# Patient Record
Sex: Female | Born: 1970 | Race: White | Hispanic: No | Marital: Married | Smoking: Never smoker
Health system: Southern US, Community
[De-identification: ages and names within clinical notes are randomized; demographics above are authoritative.]

## PROBLEM LIST (undated history)

## (undated) DIAGNOSIS — F32A Depression, unspecified: Secondary | ICD-10-CM

## (undated) DIAGNOSIS — R109 Unspecified abdominal pain: Secondary | ICD-10-CM

## (undated) DIAGNOSIS — Z87442 Personal history of urinary calculi: Secondary | ICD-10-CM

## (undated) DIAGNOSIS — F988 Other specified behavioral and emotional disorders with onset usually occurring in childhood and adolescence: Secondary | ICD-10-CM

## (undated) DIAGNOSIS — Z87440 Personal history of urinary (tract) infections: Secondary | ICD-10-CM

## (undated) DIAGNOSIS — F329 Major depressive disorder, single episode, unspecified: Secondary | ICD-10-CM

## (undated) HISTORY — DX: Unspecified abdominal pain: R10.9

## (undated) HISTORY — DX: Personal history of urinary (tract) infections: Z87.440

## (undated) HISTORY — DX: Depression, unspecified: F32.A

## (undated) HISTORY — PX: OTHER SURGICAL HISTORY: SHX169

## (undated) HISTORY — DX: Other specified behavioral and emotional disorders with onset usually occurring in childhood and adolescence: F98.8

## (undated) HISTORY — DX: Major depressive disorder, single episode, unspecified: F32.9

## (undated) HISTORY — DX: Personal history of urinary calculi: Z87.442

---

## 2007-09-02 ENCOUNTER — Ambulatory Visit: Payer: Self-pay | Admitting: Internal Medicine

## 2010-10-22 ENCOUNTER — Emergency Department: Payer: Self-pay | Admitting: Emergency Medicine

## 2010-12-09 ENCOUNTER — Ambulatory Visit: Payer: Self-pay | Admitting: Family Medicine

## 2011-10-04 ENCOUNTER — Emergency Department: Payer: Self-pay | Admitting: Emergency Medicine

## 2011-10-04 LAB — URINALYSIS, COMPLETE
Bacteria: NONE SEEN
Bilirubin,UR: NEGATIVE
Glucose,UR: NEGATIVE mg/dL (ref 0–75)
Ketone: NEGATIVE
Ph: 6 (ref 4.5–8.0)
Protein: NEGATIVE
RBC,UR: 1 /HPF (ref 0–5)
Specific Gravity: 1.011 (ref 1.003–1.030)
Squamous Epithelial: NONE SEEN
WBC UR: 5 /HPF (ref 0–5)

## 2011-10-04 LAB — CBC
HCT: 39.4 % (ref 35.0–47.0)
HGB: 13.4 g/dL (ref 12.0–16.0)
MCH: 31.8 pg (ref 26.0–34.0)
MCHC: 33.9 g/dL (ref 32.0–36.0)

## 2011-10-04 LAB — BASIC METABOLIC PANEL
Calcium, Total: 8.7 mg/dL (ref 8.5–10.1)
Co2: 27 mmol/L (ref 21–32)
Creatinine: 0.67 mg/dL (ref 0.60–1.30)
EGFR (African American): >60
EGFR (Non-African Amer.): >60
Potassium: 3.4 mmol/L — ABNORMAL LOW (ref 3.5–5.1)

## 2014-08-08 ENCOUNTER — Ambulatory Visit: Payer: Self-pay

## 2015-09-16 ENCOUNTER — Encounter: Payer: Self-pay | Admitting: *Deleted

## 2015-09-16 ENCOUNTER — Ambulatory Visit (INDEPENDENT_AMBULATORY_CARE_PROVIDER_SITE_OTHER): Admitting: Obstetrics and Gynecology

## 2015-09-16 VITALS — BP 124/85 | HR 80 | Resp 16 | Ht 64.0 in | Wt 149.0 lb

## 2015-09-16 DIAGNOSIS — F32A Depression, unspecified: Secondary | ICD-10-CM | POA: Insufficient documentation

## 2015-09-16 DIAGNOSIS — N200 Calculus of kidney: Secondary | ICD-10-CM | POA: Diagnosis not present

## 2015-09-16 DIAGNOSIS — F329 Major depressive disorder, single episode, unspecified: Secondary | ICD-10-CM | POA: Insufficient documentation

## 2015-09-16 DIAGNOSIS — F988 Other specified behavioral and emotional disorders with onset usually occurring in childhood and adolescence: Secondary | ICD-10-CM | POA: Insufficient documentation

## 2015-09-16 DIAGNOSIS — R109 Unspecified abdominal pain: Secondary | ICD-10-CM | POA: Diagnosis not present

## 2015-09-16 LAB — URINALYSIS, COMPLETE
Bilirubin, UA: NEGATIVE
Glucose, UA: NEGATIVE
LEUKOCYTES UA: NEGATIVE
Nitrite, UA: NEGATIVE
Specific Gravity, UA: 1.02 (ref 1.005–1.030)
Urobilinogen, Ur: 0.2 mg/dL (ref 0.2–1.0)
pH, UA: 6 (ref 5.0–7.5)

## 2015-09-16 LAB — MICROSCOPIC EXAMINATION

## 2015-09-16 NOTE — Progress Notes (Signed)
09/16/2015 12:46 PM   Vickie Raymond 05/20/1971 [id number]  Referring provider: No referring provider defined for this encounter.  Chief Complaint  Patient presents with  . Nephrolithiasis  . Establish Care    HPI: Patient is a 44yo female with a history of renal stones presenting today for follow up after being seen at an ED in [place] on 09/11/15.  CT w/o contrast showing no renal or ureteral stones (only report available). She was diagnosed with pylonephritis and treated with Keflex.  Urine dip was nitrite positive. No culture available.   She reports she was first diagnosed with renal stones in 1998 while she was pregnant. She was later seen in the ED in 2000. A ureteral stent was placed at that time and stone was able to pass. She was then seen by Urology with plans for remaining renal stone removal but was deployed prior to surgery and did not follow up when she returned.  Patient's last visit with [place] was approximately 1 year ago. A CT renal stone study was ordered at that time noting  Bilateral nonobstructing renal stones.  7 x 5 mm left lower pole renal stone as well as a 1 millimeter right upper pole renal stone. Patient was to be scheduled for a URS for left renal stone removal but did not follow up for surgery to be scheduled.   Currently she denies pain, dysuria, frequency, urgency or fevers. No gross hematuria.    PMH: Past Medical History  Diagnosis Date  . ADD (attention deficit disorder)   . Depression   . Pain, flank, bilateral   . History of UTI   . History of kidney stones     Surgical History: Past Surgical History  Procedure Laterality Date  . Kidney stone removal      Home Medications:    Medication List       This list is accurate as of: 09/16/15 12:46 PM.  Always use your most recent med list.               cephALEXin 500 MG capsule  Commonly known as:  KEFLEX  Take 500 mg by mouth 3 (three) times daily.        Allergies:   Allergies  Allergen Reactions  . Penicillin G     Family History: Family History  Problem Relation Age of Onset  . Kidney disease Neg Hx   . Cervical cancer Mother   . Bladder Cancer Paternal Uncle   . Colon cancer Paternal Grandmother     Social History:  reports that she has never smoked. She does not have any smokeless tobacco history on file. She reports that she drinks alcohol. Her drug history is not on file.  ROS: UROLOGY Frequent Urination?: No Hard to postpone urination?: No Burning/pain with urination?: No Get up at night to urinate?: No Leakage of urine?: No Urine stream starts and stops?: No Trouble starting stream?: No Do you have to strain to urinate?: No Blood in urine?: No Urinary tract infection?: Yes Sexually transmitted disease?: No Injury to kidneys or bladder?: No Painful intercourse?: No Weak stream?: No Currently pregnant?: No Vaginal bleeding?: No Last menstrual period?: 09/15/2015  Gastrointestinal Nausea?: Yes Vomiting?: Yes Indigestion/heartburn?: No Diarrhea?: No Constipation?: No  Constitutional Fever: No Night sweats?: No Weight loss?: No Fatigue?: No  Skin Skin rash/lesions?: No Itching?: No  Eyes Blurred vision?: No Double vision?: No  Ears/Nose/Throat Sore throat?: No Sinus problems?: No  Hematologic/Lymphatic Swollen glands?: No Easy bruising?: No  Cardiovascular Leg swelling?: No Chest pain?: No  Respiratory Cough?: No Shortness of breath?: No  Endocrine Excessive thirst?: No  Musculoskeletal Back pain?: Yes Joint pain?: No  Neurological Headaches?: No Dizziness?: No  Psychologic Depression?: No Anxiety?: No  Physical Exam: BP 124/85 mmHg  Pulse 80  Resp 16  Ht 5\' 4"  (1.626 m)  Wt 149 lb (67.586 kg)  BMI 25.56 kg/m2  Constitutional:  Alert and oriented, No acute distress. HEENT: [place] AT, moist mucus membranes.  Trachea midline, no masses. Cardiovascular: No clubbing, cyanosis, or  edema. Respiratory: Normal respiratory effort, no increased work of breathing. GI: Abdomen is soft, nontender, nondistended, no abdominal masses GU: No CVA tenderness.  Skin: No rashes, bruises or suspicious lesions. Lymph: No cervical or inguinal adenopathy. Neurologic: Grossly intact, no focal deficits, moving all 4 extremities. Psychiatric: Normal mood and affect.  Laboratory Data:   Urinalysis    Component Value Date/Time   COLORURINE Yellow 10/04/2011 2110   APPEARANCEUR Hazy 10/04/2011 2110   LABSPEC 1.011 10/04/2011 2110   PHURINE 6.0 10/04/2011 2110   GLUCOSEU Negative 10/04/2011 2110   HGBUR Negative 10/04/2011 2110   BILIRUBINUR Negative 10/04/2011 2110   KETONESUR Negative 10/04/2011 2110   PROTEINUR Negative 10/04/2011 2110   NITRITE Negative 10/04/2011 2110   LEUKOCYTESUR 1+ 10/04/2011 2110    Pertinent Imaging:   Assessment & Plan:    1. Nephrolithiasis-  Most recent CT scan without contrast showing no renal or ureteral stones. Patient reports improvement in her urinary symptoms. She does not recall passing her previously identified left renal stone.   She would like to be sure that the stone no longer resides in her kidney. We will order a renal ultrasound and a KUB for further confirmation though I explained to her that a CT scan is more specific than these 2 studies.   2. Left Flank Pain- Patient reports mild residual left flank soreness but states that pain has significantly improved since she has been taking Keflex.  3.  Acute Cystitis-  We will recheck patient's urine at her follow-up visit after completion of antibiotics.  There are no diagnoses linked to this encounter.  Return for RUS and KUB results..  These notes generated with voice recognition software. I apologize for typographical errors.  Earlie LouLindsay Davetta Olliff, FNP  [facility] [address], [address] [place], [place] [address] [phone]

## 2015-09-17 ENCOUNTER — Encounter: Payer: Self-pay | Admitting: *Deleted

## 2015-09-18 ENCOUNTER — Ambulatory Visit (INDEPENDENT_AMBULATORY_CARE_PROVIDER_SITE_OTHER): Admitting: Obstetrics and Gynecology

## 2015-09-18 ENCOUNTER — Ambulatory Visit
Admission: RE | Admit: 2015-09-18 | Discharge: 2015-09-18 | Disposition: A | Discharge status: Home / Self Care | Source: Ambulatory Visit | Attending: Obstetrics and Gynecology | Admitting: Obstetrics and Gynecology

## 2015-09-18 ENCOUNTER — Encounter: Payer: Self-pay | Admitting: Obstetrics and Gynecology

## 2015-09-18 VITALS — BP 120/81 | HR 64 | Resp 16 | Ht 64.0 in | Wt 149.8 lb

## 2015-09-18 DIAGNOSIS — Z87442 Personal history of urinary calculi: Secondary | ICD-10-CM | POA: Insufficient documentation

## 2015-09-18 DIAGNOSIS — R109 Unspecified abdominal pain: Secondary | ICD-10-CM | POA: Insufficient documentation

## 2015-09-18 DIAGNOSIS — N200 Calculus of kidney: Secondary | ICD-10-CM

## 2015-09-18 DIAGNOSIS — N281 Cyst of kidney, acquired: Secondary | ICD-10-CM | POA: Insufficient documentation

## 2015-09-18 LAB — MICROSCOPIC EXAMINATION
RBC, UA: NONE SEEN /hpf (ref 0–?)
WBC UA: NONE SEEN /HPF (ref 0–?)

## 2015-09-18 LAB — URINALYSIS, COMPLETE
BILIRUBIN UA: NEGATIVE
GLUCOSE, UA: NEGATIVE
Ketones, UA: NEGATIVE
LEUKOCYTES UA: NEGATIVE
Nitrite, UA: NEGATIVE
PROTEIN UA: NEGATIVE
Specific Gravity, UA: 1.02 (ref 1.005–1.030)
UUROB: 0.2 mg/dL (ref 0.2–1.0)
pH, UA: 5.5 (ref 5.0–7.5)

## 2015-09-18 NOTE — Patient Instructions (Signed)
Dietary Guidelines to Help Prevent Kidney Stones °Your risk of kidney stones can be decreased by adjusting the foods you eat. The most important thing you can do is drink enough fluid. You should drink enough fluid to keep your urine clear or pale yellow. The following guidelines provide specific information for the type of kidney stone you have had. °GUIDELINES ACCORDING TO TYPE OF KIDNEY STONE °Calcium Oxalate Kidney Stones °· Reduce the amount of salt you eat. Foods that have a lot of salt cause your body to release excess calcium into your urine. The excess calcium can combine with a substance called oxalate to form kidney stones. °· Reduce the amount of animal protein you eat if the amount you eat is excessive. Animal protein causes your body to release excess calcium into your urine. Ask your dietitian how much protein from animal sources you should be eating. °· Avoid foods that are high in oxalates. If you take vitamins, they should have less than 500 mg of vitamin C. Your body turns vitamin C into oxalates. You do not need to avoid fruits and vegetables high in vitamin C. °Calcium Phosphate Kidney Stones °· Reduce the amount of salt you eat to help prevent the release of excess calcium into your urine. °· Reduce the amount of animal protein you eat if the amount you eat is excessive. Animal protein causes your body to release excess calcium into your urine. Ask your dietitian how much protein from animal sources you should be eating. °· Get enough calcium from food or take a calcium supplement (ask your dietitian for recommendations). Food sources of calcium that do not increase your risk of kidney stones include: °¨ Broccoli. °¨ Dairy products, such as cheese and yogurt. °¨ Pudding. °Uric Acid Kidney Stones °· Do not have more than 6 oz of animal protein per day. °FOOD SOURCES °Animal Protein Sources °· Meat (all types). °· Poultry. °· Eggs. °· Fish, seafood. °Foods High in Salt °· Salt seasonings. °· Soy  sauce. °· Teriyaki sauce. °· Cured and processed meats. °· Salted crackers and snack foods. °· Fast food. °· Canned soups and most canned foods. °Foods High in Oxalates °· Grains: °¨ Amaranth. °¨ Barley. °¨ Grits. °¨ Wheat germ. °¨ Bran. °¨ Buckwheat flour. °¨ All bran cereals. °¨ Pretzels. °¨ Whole wheat bread. °· Vegetables: °¨ Beans (wax). °¨ Beets and beet greens. °¨ Collard greens. °¨ Eggplant. °¨ Escarole. °¨ Leeks. °¨ Okra. °¨ Parsley. °¨ Rutabagas. °¨ Spinach. °¨ Swiss chard. °¨ Tomato paste. °¨ Fried potatoes. °¨ Sweet potatoes. °· Fruits: °¨ Red currants. °¨ Figs. °¨ Kiwi. °¨ Rhubarb. °· Meat and Other Protein Sources: °¨ Beans (dried). °¨ Soy burgers and other soybean products. °¨ Miso. °¨ Nuts (peanuts, almonds, pecans, cashews, hazelnuts). °¨ Nut butters. °¨ Sesame seeds and tahini (paste made of sesame seeds). °¨ Poppy seeds. °· Beverages: °¨ Chocolate drink mixes. °¨ Soy milk. °¨ Instant iced tea. °¨ Juices made from high-oxalate fruits or vegetables. °· Other: °¨ Carob. °¨ Chocolate. °¨ Fruitcake. °¨ Marmalades. °  °This information is not intended to replace advice given to you by your health care provider. Make sure you discuss any questions you have with your health care provider. °  °Document Released: 01/09/2011 Document Revised: 09/19/2013 Document Reviewed: 08/11/2013 °Elsevier Interactive Patient Education ©2016 Else[employer] Inc[employer] ° ° °Kidney Stones °Kidney stones (urolithiasis) are deposits that form inside your kidneys. The intense pain is caused by the stone moving through the urinary tract. When the stone moves, the   ureter goes into spasm around the stone. The stone is usually passed in the urine.  °CAUSES  °· A disorder that makes certain neck glands produce too much parathyroid hormone (primary hyperparathyroidism). °· A buildup of uric acid crystals, similar to gout in your joints. °· Narrowing (stricture) of the ureter. °· A kidney obstruction present at birth (congenital  obstruction). °· Previous surgery on the kidney or ureters. °· Numerous kidney infections. °SYMPTOMS  °· Feeling sick to your stomach (nauseous). °· Throwing up (vomiting). °· Blood in the urine (hematuria). °· Pain that usually spreads (radiates) to the groin. °· Frequency or urgency of urination. °DIAGNOSIS  °· Taking a history and physical exam. °· Blood or urine tests. °· CT scan. °· Occasionally, an examination of the inside of the urinary bladder (cystoscopy) is performed. °TREATMENT  °· Observation. °· Increasing your fluid intake. °· Extracorporeal shock wave lithotripsy--This is a noninvasive procedure that uses shock waves to break up kidney stones. °· Surgery may be needed if you have severe pain or persistent obstruction. There are various surgical procedures. Most of the procedures are performed with the use of small instruments. Only small incisions are needed to accommodate these instruments, so recovery time is minimized. °The size, location, and chemical composition are all important variables that will determine the proper choice of action for you. Talk to your health care provider to better understand your situation so that you will minimize the risk of injury to yourself and your kidney.  °HOME CARE INSTRUCTIONS  °· Drink enough water and fluids to keep your urine clear or pale yellow. This will help you to pass the stone or stone fragments. °· Strain all urine through the provided strainer. Keep all particulate matter and stones for your health care provider to see. The stone causing the pain may be as small as a grain of salt. It is very important to use the strainer each and every time you pass your urine. The collection of your stone will allow your health care provider to analyze it and verify that a stone has actually passed. The stone analysis will often identify what you can do to reduce the incidence of recurrences. °· Only take over-the-counter or prescription medicines for pain,  discomfort, or fever as directed by your health care provider. °· Keep all follow-up visits as told by your health care provider. This is important. °· Get follow-up X-rays if required. The absence of pain does not always mean that the stone has passed. It may have only stopped moving. If the urine remains completely obstructed, it can cause loss of kidney function or even complete destruction of the kidney. It is your responsibility to make sure X-rays and follow-ups are completed. Ultrasounds of the kidney can show blockages and the status of the kidney. Ultrasounds are not associated with any radiation and can be performed easily in a matter of minutes. °· Make changes to your daily diet as told by your health care provider. You may be told to: °¨ Limit the amount of salt that you eat. °¨ Eat 5 or more servings of fruits and vegetables each day. °¨ Limit the amount of meat, poultry, fish, and eggs that you eat. °· Collect a 24-hour urine sample as told by your health care provider. You may need to collect another urine sample every 6-12 months. °SEEK MEDICAL CARE IF: °· You experience pain that is progressive and unresponsive to any pain medicine you have been prescribed. °SEEK IMMEDIATE MEDICAL CARE IF:  °·   Pain cannot be controlled with the prescribed medicine. °· You have a fever or shaking chills. °· The severity or intensity of pain increases over 18 hours and is not relieved by pain medicine. °· You develop a new onset of abdominal pain. °· You feel faint or pass out. °· You are unable to urinate. °  °This information is not intended to replace advice given to you by your health care provider. Make sure you discuss any questions you have with your health care provider. °  °Document Released: 09/14/2005 Document Revised: 06/05/2015 Document Reviewed: 02/15/2013 °Elsevier Interactive Patient Education ©2016 Elsevier Inc. ° °

## 2015-09-18 NOTE — Progress Notes (Signed)
4:37 PM   Vickie RoesKristi G Raymond 1971/09/04 [id number]  Referring provider: Jerl MinaJames Hedrick, MD [address] [facility] [place], [place] [address]  Chief Complaint  Patient presents with  . Results    US & KUB  . Nephrolithiasis    HPI: Patient is a 44yo female with a history of renal stones presenting today for follow up after being seen at an ED in [place] on 09/11/15.  CT w/o contrast showing no renal or ureteral stones (only report available). She was diagnosed with pylonephritis and treated with Keflex.  Urine dip was nitrite positive. No culture available.   She reports she was first diagnosed with renal stones in 1998 while she was pregnant. She was later seen in the ED in 2000. A ureteral stent was placed at that time and stone was able to pass. She was then seen by Urology with plans for remaining renal stone removal but was deployed prior to surgery and did not follow up when she returned.  Patient's last visit with us was approximately 1 year ago. A CT renal stone study was ordered at that time noting  Bilateral nonobstructing renal stones.  7 x 5 mm left lower pole renal stone as well as a 1 millimeter right upper pole renal stone. Patient was to be scheduled for a URS for left renal stone removal but did not follow up for surgery to be scheduled.   Currently she denies pain, dysuria, frequency, urgency or fevers. No gross hematuria.   Current Status: Patient presents today to review renal ultrasound and KUB results. She denies any urinary symptoms, fevers or flank pain. Renal ultrasound showing no signs of obstruction or renal stones. KUB showing a possible small 2 mm right renal stone but was otherwise negative.   PMH: Past Medical History  Diagnosis Date  . ADD (attention deficit disorder)   . Depression   . Pain, flank, bilateral   . History of UTI   . History of kidney stones     Surgical History: Past Surgical History  Procedure Laterality Date  . Kidney  stone removal      Home Medications:    Medication List       This list is accurate as of: 09/18/15  4:37 PM.  Always use your most recent med list.               cephALEXin 500 MG capsule  Commonly known as:  KEFLEX  Take 500 mg by mouth 3 (three) times daily.     valACYclovir 500 MG tablet  Commonly known as:  VALTREX  Take 500 mg by mouth 2 (two) times daily.        Allergies:  Allergies  Allergen Reactions  . Penicillin G     Family History: Family History  Problem Relation Age of Onset  . Kidney disease Neg Hx   . Cervical cancer Mother   . Bladder Cancer Paternal Uncle   . Colon cancer Paternal Grandmother     Social History:  reports that she has never smoked. She does not have any smokeless tobacco history on file. She reports that she drinks alcohol. Her drug history is not on file.  ROS:                                        Physical Exam: BP 120/81 mmHg  Pulse 64  Resp 16  Ht  (1.626 m)  Wt 149 lb 12.8 oz (67.949 kg)  BMI 25.70 kg/m2  LMP 09/11/2015 (Exact Date)  Constitutional:  Alert and oriented, No acute distress. HEENT: [place] AT, moist mucus membranes.  Trachea midline, no masses. Cardiovascular: No clubbing, cyanosis, or edema. Respiratory: Normal respiratory effort, no increased work of breathing. GI: Abdomen is soft, nontender, nondistended, no abdominal masses GU: No CVA tenderness.  Skin: No rashes, bruises or suspicious lesions. Lymph: No cervical or inguinal adenopathy. Neurologic: Grossly intact, no focal deficits, moving all 4 extremities. Psychiatric: Normal mood and affect.  Laboratory Data:   Urinalysis Results for orders placed or performed in visit on 09/16/15  Microscopic Examination  Result Value Ref Range   WBC, UA 0-5 0 -  5 /hpf   RBC, UA 0-2 0 -  2 /hpf   Epithelial Cells (non renal) 0-10 0 - 10 /hpf   Mucus, UA Present (A) Not Estab.   Bacteria, UA Many (A) None seen/Few   Urinalysis, Complete  Result Value Ref Range   Specific Gravity, UA 1.020 1.005 - 1.030   pH, UA 6.0 5.0 - 7.5   Color, UA Yellow Yellow   Appearance Ur Clear Clear   Leukocytes, UA Negative Negative   Protein, UA 1+ (A) Negative/Trace   Glucose, UA Negative Negative   Ketones, UA Trace (A) Negative   RBC, UA 2+ (A) Negative   Bilirubin, UA Negative Negative   Urobilinogen, Ur 0.2 0.2 - 1.0 mg/dL   Nitrite, UA Negative Negative   Microscopic Examination See below:      Pertinent Imaging: CLINICAL DATA: Left flank pain for 1 week, history of kidney stones  EXAM: RENAL / URINARY TRACT ULTRASOUND COMPLETE  COMPARISON: 08/08/2014  FINDINGS: Right Kidney:  Length: 11.9 cm. 1.3 cm cyst is noted in the mid to lower pole similar to a hypodensity seen on prior CT.  Left Kidney:  Length: 12.3 cm. The previously seen calculus is not well appreciated on this exam.  Bladder:  Appears normal for degree of bladder distention.  IMPRESSION: Right renal cyst  Previously seen left renal calculus is no longer present. This would correspond with the patient's given clinical history.   Electronically Signed  By: Alcide Clever M.D.  On: 09/18/2015 14:19  CLINICAL DATA: One week history of left flank pain, history of kidney stones and kidney infection  EXAM: ABDOMEN - 1 VIEW  COMPARISON: Renal ultrasound of today's date  FINDINGS: The bowel gas pattern is unremarkable. An umbilical pin and IUD are present. There are is a 2 mm calcification projecting over the lower pole of the right kidney. No left kidney stones are observed. Along the course of the ureters no stones are evident. There are phleboliths in the pelvis but no evidence of bladder stones. The bony structures exhibit no acute abnormalities.  IMPRESSION: Possible 2 mm right-sided lower pole kidney stone. No definite stones observed elsewhere. No acute intra-abdominal  abnormality.   Electronically Signed  By: David [place]az[place]and M.D.  On: 09/18/2015 14:34       Assessment & Plan:    1. Nephrolithiasis-  Renal ultrasound showing no signs of obstruction or renal stones. KUB showing a possible small 2 mm right renal stone but was otherwise negative.   2. Left Flank Pain- Resolved.  3.  Acute Cystitis-  Resolved.  There are no diagnoses linked to this encounter.  Return if symptoms worsen or fail to improve.  These notes generated with voice recognition software. I apologize  for typographical errors.  Earlie Lou, FNP  [facility] [address] [place], [place] [address] [phone]

## 2015-10-04 ENCOUNTER — Other Ambulatory Visit: Payer: Self-pay | Admitting: Family Medicine

## 2015-10-04 DIAGNOSIS — Z1231 Encounter for screening mammogram for malignant neoplasm of breast: Secondary | ICD-10-CM

## 2015-11-25 ENCOUNTER — Ambulatory Visit

## 2015-11-25 ENCOUNTER — Ambulatory Visit
Admission: RE | Admit: 2015-11-25 | Discharge: 2015-11-25 | Disposition: A | Discharge status: Home / Self Care | Source: Ambulatory Visit | Attending: Family Medicine | Admitting: Family Medicine

## 2015-11-25 DIAGNOSIS — Z1231 Encounter for screening mammogram for malignant neoplasm of breast: Secondary | ICD-10-CM | POA: Insufficient documentation

## 2018-07-03 ENCOUNTER — Emergency Department

## 2018-07-03 ENCOUNTER — Other Ambulatory Visit: Payer: Self-pay

## 2018-07-03 ENCOUNTER — Emergency Department
Admission: EM | Admit: 2018-07-03 | Discharge: 2018-07-03 | Disposition: A | Discharge status: Home / Self Care | Attending: Emergency Medicine | Admitting: Emergency Medicine

## 2018-07-03 DIAGNOSIS — S32020A Wedge compression fracture of second lumbar vertebra, initial encounter for closed fracture: Secondary | ICD-10-CM | POA: Diagnosis not present

## 2018-07-03 DIAGNOSIS — Z79899 Other long term (current) drug therapy: Secondary | ICD-10-CM | POA: Diagnosis not present

## 2018-07-03 DIAGNOSIS — Y929 Unspecified place or not applicable: Secondary | ICD-10-CM | POA: Insufficient documentation

## 2018-07-03 DIAGNOSIS — M545 Low back pain, unspecified: Secondary | ICD-10-CM

## 2018-07-03 DIAGNOSIS — Y939 Activity, unspecified: Secondary | ICD-10-CM | POA: Insufficient documentation

## 2018-07-03 DIAGNOSIS — W11XXXA Fall on and from ladder, initial encounter: Secondary | ICD-10-CM | POA: Diagnosis not present

## 2018-07-03 DIAGNOSIS — S3992XA Unspecified injury of lower back, initial encounter: Secondary | ICD-10-CM | POA: Diagnosis present

## 2018-07-03 DIAGNOSIS — S32010A Wedge compression fracture of first lumbar vertebra, initial encounter for closed fracture: Secondary | ICD-10-CM | POA: Insufficient documentation

## 2018-07-03 DIAGNOSIS — Y999 Unspecified external cause status: Secondary | ICD-10-CM | POA: Diagnosis not present

## 2018-07-03 LAB — POCT PREGNANCY, URINE: Preg Test, Ur: NEGATIVE

## 2018-07-03 MED ORDER — ORPHENADRINE CITRATE 30 MG/ML IJ SOLN
60.0000 mg | Freq: Two times a day (BID) | INTRAMUSCULAR | Status: DC
  Administered 2018-07-03: 60 mg via INTRAMUSCULAR
  Filled 2018-07-03: qty 2

## 2018-07-03 MED ORDER — KETOROLAC TROMETHAMINE 60 MG/2ML IM SOLN
60.0000 mg | Freq: Once | INTRAMUSCULAR | Status: AC
  Administered 2018-07-03: 60 mg via INTRAMUSCULAR
  Filled 2018-07-03: qty 2

## 2018-07-03 MED ORDER — HYDROCODONE-ACETAMINOPHEN 5-325 MG PO TABS: 1.0000 | ORAL_TABLET | Freq: Four times a day (QID) | ORAL | 0 refills | Status: AC | PRN

## 2018-07-03 NOTE — ED Provider Notes (Signed)
[facility] Emergency Department Provider Note ____________________________________________  Time seen: 1500  I have reviewed the triage vital signs and the nursing notes.  HISTORY  Chief Complaint  Fall   HPI Vickie Raymond is a 47 y.o. female presents to the ER today with complaint of low back pain after falling off a ladder.  She reports this occurred about 4 hours ago.  She fell approximately 6 feet and landed on her back.  She is complaining of low back pain.  She did describes the pain as tight and spasming.  The pain does radiate down her right hamstring.  She denies numbness tingling or weakness.  She was able to ambulate immediately after the accident.  She has urinated since the fall and has not noticed any blood in her urine.  She has taken Tylenol with minimal relief.  She does report history of a chronic back issues and is currently being worked up by the [place] for the same.  She denies previous back surgery.  Past Medical History:  Diagnosis Date  . ADD (attention deficit disorder)   . Depression   . History of kidney stones   . History of UTI   . Pain, flank, bilateral     Patient Active Problem List   Diagnosis Date Noted  . ADD (attention deficit disorder) 09/16/2015  . Clinical depression 09/16/2015  . Calculus of kidney 09/16/2015    Past Surgical History:  Procedure Laterality Date  . kidney stone removal      Prior to Admission medications   Medication Sig Start Date End Date Taking? Authorizing Provider  cephALEXin (KEFLEX) 500 MG capsule Take 500 mg by mouth 3 (three) times daily. 09/12/15   [provider]  HYDROcodone-acetaminophen (NORCO/VICODIN) 5-325 MG tablet Take 1 tablet by mouth every 6 (six) hours as needed for moderate pain. 07/03/18 07/03/19  Lorre Munroe, NP  valACYclovir (VALTREX) 500 MG tablet Take 500 mg by mouth 2 (two) times daily.    [provider]    Allergies Penicillin g  Family History   Problem Relation Age of Onset  . Cervical cancer Mother   . Bladder Cancer Paternal Uncle   . Colon cancer Paternal Grandmother   . Kidney disease Neg Hx     Social History Social History   Tobacco Use  . Smoking status: Never Smoker  Substance Use Topics  . Alcohol use: Yes    Alcohol/week: 0.0 standard drinks  . Drug use: Not on file    Review of Systems  Constitutional: Negative for fever. Cardiovascular: Negative for chest pain. Respiratory: Negative for shortness of breath. Gastrointestinal: Negative for abdominal pain, but in stool. Genitourinary: Negative for urine. Musculoskeletal: Stiff for back pain.  Negative for neck pain. Skin: Negative for bruising Neurological: Negative for focal weakness, tingling or numbness. ____________________________________________  PHYSICAL EXAM:  VITAL SIGNS: ED Triage Vitals  Enc Vitals Group     BP 07/03/18 1414 116/64     Pulse Rate 07/03/18 1414 85     Resp 07/03/18 1414 18     Temp 07/03/18 1414 98.5 F (36.9 C)     Temp src --      SpO2 --      Weight 07/03/18 1413 155 lb (70.3 kg)     Height 07/03/18 1413 5\' 4"  (1.626 m)     Head Circumference --      Peak Flow --      Pain Score 07/03/18 1413 8  Pain Loc --      Pain Edu? --      Excl. in [place]? --     Constitutional: Alert and oriented. Well appearing, appears uncomfortable. Head: Normocephalic and atraumatic. Eyes:  PERRL. Normal extraocular movements Cardiovascular: Normal rate, regular rhythm.  Pedal pulses 2+ bilaterally Respiratory: Normal respiratory effort. No wheezes/rales/rhonchi. Gastrointestinal: Soft and nontender.  Musculoskeletal: Decreased flexion, extension and rotation of the lumbar spine secondary to pain.  Pain with palpation over the lower lumbar spine/sacral area.  Normal internal and external rotation of the right hip.  Normal abduction and abduction of the left hip.  Strength 5/5 BLE.  Gait not visualized. Neurologic: Station intact  BLE.  Negative SLR on the right. Skin:  Skin is warm, dry and intact. No oozing or abrasions noted.  ____________________________________________   RADIOLOGY   Imaging Orders     DG Lumbar Spine 2-3 Views   IMPRESSION: Compression fractures identified at L1 and L2, mainly involving the superior endplates. Fractured each level results in less than 25% loss of vertebral body height. No evidence for gross fragment retropulsion into the spinal canal by x-ray. ____________________________________________   INITIAL IMPRESSION / ASSESSMENT AND PLAN / ED COURSE  Acute Low Back Pain s/p Fall off Ladder:  Xray lumbar spine c/w compression fracture of LI, L2 Norflex 60 mg IM in ER Toradol 60 mg IM in ER RX for Hydrocodone-Acetaminophen 5-325 mg Q6H #20, 0 refills Alternating ice and heat may be helpful Back exercises given Referral to Dr. Martha Clan placed    I reviewed the patient's prescription history over the last 12 months in the multi-state controlled substances database(s) that includes [place], [place], [place], [place], [place], [place], [place], [place], [place], [place], [place], [place], [place], and [place].  Results were notable for Veritussin #120 mL 05/2018 ____________________________________________  FINAL CLINICAL IMPRESSION(S) / ED DIAGNOSES  Final diagnoses:  Fall from ladder, initial encounter  Acute midline low back pain without sciatica  Compression fracture of L1 vertebra, initial encounter [facility])  Compression fracture of L2 vertebra, initial encounter [facility])      Lorre Munroe, NP 07/03/18 1702    Arnaldo Natal, MD 07/04/18 [phone]

## 2018-07-03 NOTE — Discharge Instructions (Signed)
You have been diagnosed with a L1 and L2 compression fracture. This usually heals on it's own without surgical intervention within 4-6 weeks. I have provided you a RX for Hydrocodone-Acetaminophen to take every 6 hours as needed for pain. You can also take Ibuprofen 600 mg every 8 hours, to help decrease inflammation. Alternating heat and ice will be helpful. Avoid heavy lifting, bending and squatting. I have referred you to Dr. Martha Clan for followup. Please schedule an appt in 1 week.

## 2018-07-03 NOTE — ED Notes (Signed)
POC resulted negative 

## 2018-07-03 NOTE — ED Triage Notes (Signed)
Pt c/o low back pain after falling off a ladder.

## 2019-06-18 ENCOUNTER — Encounter: Payer: Self-pay | Admitting: Emergency Medicine

## 2019-06-18 ENCOUNTER — Emergency Department
Admission: EM | Admit: 2019-06-18 | Discharge: 2019-06-18 | Disposition: A | Discharge status: Home / Self Care | Attending: Emergency Medicine | Admitting: Emergency Medicine

## 2019-06-18 ENCOUNTER — Other Ambulatory Visit: Payer: Self-pay

## 2019-06-18 ENCOUNTER — Emergency Department

## 2019-06-18 DIAGNOSIS — R3129 Other microscopic hematuria: Secondary | ICD-10-CM | POA: Insufficient documentation

## 2019-06-18 DIAGNOSIS — R1031 Right lower quadrant pain: Secondary | ICD-10-CM | POA: Diagnosis present

## 2019-06-18 DIAGNOSIS — R109 Unspecified abdominal pain: Secondary | ICD-10-CM

## 2019-06-18 LAB — URINALYSIS, COMPLETE (UACMP) WITH MICROSCOPIC
Bacteria, UA: NONE SEEN
Bilirubin Urine: NEGATIVE
Glucose, UA: NEGATIVE mg/dL
Ketones, ur: NEGATIVE mg/dL
Leukocytes,Ua: NEGATIVE
Nitrite: NEGATIVE
Protein, ur: NEGATIVE mg/dL
Specific Gravity, Urine: 1.02 (ref 1.005–1.030)
pH: 7 (ref 5.0–8.0)

## 2019-06-18 LAB — BASIC METABOLIC PANEL
Anion gap: 6 (ref 5–15)
BUN: 16 mg/dL (ref 6–20)
CO2: 26 mmol/L (ref 22–32)
Calcium: 8.9 mg/dL (ref 8.9–10.3)
Chloride: 104 mmol/L (ref 98–111)
Creatinine, Ser: 0.71 mg/dL (ref 0.44–1.00)
GFR calc Af Amer: >60 mL/min (ref >60)
GFR calc non Af Amer: >60 mL/min (ref >60)
Glucose, Bld: 98 mg/dL (ref 70–99)
Potassium: 4 mmol/L (ref 3.5–5.1)
Sodium: 136 mmol/L (ref 135–145)

## 2019-06-18 LAB — CBC
HCT: 39.5 % (ref 36.0–46.0)
Hemoglobin: 13.3 g/dL (ref 12.0–15.0)
MCH: 30.8 pg (ref 26.0–34.0)
MCHC: 33.7 g/dL (ref 30.0–36.0)
MCV: 91.4 fL (ref 80.0–100.0)
Platelets: 235 10*3/uL (ref 150–400)
RBC: 4.32 MIL/uL (ref 3.87–5.11)
RDW: 12.7 % (ref 11.5–15.5)
WBC: 7.9 10*3/uL (ref 4.0–10.5)
nRBC: 0 % (ref 0.0–0.2)

## 2019-06-18 LAB — POCT PREGNANCY, URINE: Preg Test, Ur: NEGATIVE

## 2019-06-18 NOTE — Discharge Instructions (Addendum)
Your CT scan does not show any current abnormalities.  Your appendix appears normal and you do not have any visible ovarian cysts.  It is likely that your symptoms are from passing a kidney stone.   Results for orders placed or performed during the hospital encounter of 06/18/19  Urinalysis, Complete w Microscopic  Result Value Ref Range   Color, Urine YELLOW (A) YELLOW   APPearance HAZY (A) CLEAR   Specific Gravity, Urine 1.020 1.005 - 1.030   pH 7.0 5.0 - 8.0   Glucose, UA NEGATIVE NEGATIVE mg/dL   Hgb urine dipstick SMALL (A) NEGATIVE   Bilirubin Urine NEGATIVE NEGATIVE   Ketones, ur NEGATIVE NEGATIVE mg/dL   Protein, ur NEGATIVE NEGATIVE mg/dL   Nitrite NEGATIVE NEGATIVE   Leukocytes,Ua NEGATIVE NEGATIVE   RBC / HPF 21-50 0 - 5 RBC/hpf   WBC, UA 0-5 0 - 5 WBC/hpf   Bacteria, UA NONE SEEN NONE SEEN   Squamous Epithelial / LPF 0-5 0 - 5   Mucus PRESENT   Basic metabolic panel  Result Value Ref Range   Sodium 136 135 - 145 mmol/L   Potassium 4.0 3.5 - 5.1 mmol/L   Chloride 104 98 - 111 mmol/L   CO2 26 22 - 32 mmol/L   Glucose, Bld 98 70 - 99 mg/dL   BUN 16 6 - 20 mg/dL   Creatinine, Ser 1.610.71 0.44 - 1.00 mg/dL   Calcium 8.9 8.9 - 09.610.3 mg/dL   GFR calc non Af Amer >60 >60 mL/min   GFR calc Af Amer >60 >60 mL/min   Anion gap 6 5 - 15  CBC  Result Value Ref Range   WBC 7.9 4.0 - 10.5 K/uL   RBC 4.32 3.87 - 5.11 MIL/uL   Hemoglobin 13.3 12.0 - 15.0 g/dL   HCT 04.539.5 40.936.0 - 81.146.0 %   MCV 91.4 80.0 - 100.0 fL   MCH 30.8 26.0 - 34.0 pg   MCHC 33.7 30.0 - 36.0 g/dL   RDW 91.412.7 78.211.5 - 95.615.5 %   Platelets 235 150 - 400 K/uL   nRBC 0.0 0.0 - 0.2 %  Pregnancy, urine POC  Result Value Ref Range   Preg Test, Ur NEGATIVE NEGATIVE   Ct Renal Stone Study  Result Date: 06/18/2019 CLINICAL DATA:  Right flank pain, history of kidney stones EXAM: CT ABDOMEN AND PELVIS WITHOUT CONTRAST TECHNIQUE: Multidetector CT imaging of the abdomen and pelvis was performed following the standard protocol  without IV contrast. COMPARISON:  08/08/2014 FINDINGS: Lower chest: Lung bases are clear. Hepatobiliary: Unenhanced liver is unremarkable. Gallbladder is unremarkable. No intrahepatic or extrahepatic ductal dilatation. Pancreas: Within normal limits. Spleen: Within normal limits. Adrenals/Urinary Tract: Adrenal glands are within normal limits. 2 mm nonobstructing right upper pole renal calculus (coronal image 81). Left kidney is within normal limits. Mild fullness of the right renal collecting system without frank hydronephrosis. When compared to the remote prior study, there was a 6 mm left lower pole renal calculus on that study which is no longer evident. Bladder is within normal limits. Stomach/Bowel: Stomach is within normal limits. No evidence of bowel obstruction. Normal appendix (series 2/image 41). Vascular/Lymphatic: No evidence of abdominal aortic aneurysm. Duplicated left IVC. No suspicious abdominopelvic lymphadenopathy. Reproductive: Uterus is notable for an IUD in satisfactory position. Bilateral ovarian cysts/follicles, measuring 3.7 cm on the left and 3.6 cm on the right, likely physiologic. Other: Small volume pelvic ascites. Musculoskeletal: Mild superior end-plate Schmorl's node deformities at L1 and L2. Degenerative changes  at L5-S1. IMPRESSION: 2 mm nonobstructing right upper pole renal calculus. Prior 6 mm left lower pole renal calculus in 2015 is no longer evident. No frank hydronephrosis. No CT findings to account for the patient's right flank pain. No evidence of bowel obstruction.  Normal appendix. Bilateral variances/follicles, measuring up to 3.7 cm, likely physiologic consider follow-up pelvic ultrasound in 6-10 weeks as clinically warranted. Additional ancillary findings as above. Electronically Signed   By: Julian Hy M.D.   On: 06/18/2019 13:51

## 2019-06-18 NOTE — ED Notes (Signed)
First Nurse Note: Pt c/o 1 episode of right flank pain PTA. Pt is in NAD.

## 2019-06-18 NOTE — ED Notes (Signed)
Pt gathered all personal belongings from room and removed them prior to ED departure  

## 2019-06-18 NOTE — ED Notes (Signed)
Returned from CT scan.

## 2019-06-18 NOTE — ED Triage Notes (Signed)
Pt arrived via POV with reports of right flank pain states she has hx of kidney stones in the past.   Pt states pain started this morning and dry heaving. Pt states when she urinated on arrival here she thinks she may have passed a stone.

## 2019-06-18 NOTE — ED Provider Notes (Signed)
[facility] Emergency Department Provider Note  ____________________________________________  Time seen: Approximately 1:13 PM  I have reviewed the triage vital signs and the nursing notes.   HISTORY  Chief Complaint Flank Pain    HPI Vickie Raymond is a 48 y.o. female with a history of kidney stones, UTI who comes the ED complaining of right flank pain that started this morning, severe, radiating to right lower quadrant, waxing and waning, constant, no aggravating or alleviating factors, feels like prior kidney stones.  On arrival to the ED she provided a urine sample and passed a small stone into the urine collection cup.  Afterward she feels much better and notes that she now has no pain and feels back to normal.    Denies any prior abdominal surgeries, other health issues, or history of ovarian cyst.     Past Medical History:  Diagnosis Date  . ADD (attention deficit disorder)   . Depression   . History of kidney stones   . History of UTI   . Pain, flank, bilateral      Patient Active Problem List   Diagnosis Date Noted  . ADD (attention deficit disorder) 09/16/2015  . Clinical depression 09/16/2015  . Calculus of kidney 09/16/2015     Past Surgical History:  Procedure Laterality Date  . kidney stone removal       Prior to Admission medications   Medication Sig Start Date End Date Taking? Authorizing Provider  buPROPion (WELLBUTRIN XL) 150 MG 24 hr tablet Take 150 mg by mouth daily. 06/16/19  Yes [provider]  methocarbamol (ROBAXIN) 500 MG tablet Take 500 mg by mouth 2 (two) times daily as needed for muscle spasms.   Yes [provider]  methylphenidate (RITALIN LA) 20 MG 24 hr capsule Take 20 mg by mouth daily. 06/17/19  Yes [provider]  HYDROcodone-acetaminophen (NORCO/VICODIN) 5-325 MG tablet Take 1 tablet by mouth every 6 (six) hours as needed for moderate pain. Patient not taking: Reported on  06/18/2019 07/03/18 07/03/19  Lorre MunroeBaity, Regina W, NP     Allergies Penicillin g and Penicillins   Family History  Problem Relation Age of Onset  . Cervical cancer Mother   . Bladder Cancer Paternal Uncle   . Colon cancer Paternal Grandmother   . Kidney disease Neg Hx     Social History Social History   Tobacco Use  . Smoking status: Never Smoker  . Smokeless tobacco: Never Used  Substance Use Topics  . Alcohol use: Yes    Alcohol/week: 0.0 standard drinks  . Drug use: Not on file    Review of Systems  Constitutional:   No fever or chills.  ENT:   No sore throat. No rhinorrhea. Cardiovascular:   No chest pain or syncope. Respiratory:   No dyspnea or cough. Gastrointestinal:   Negative for abdominal pain, vomiting and diarrhea.  Musculoskeletal:   Positive right flank pain All other systems reviewed and are negative except as documented above in ROS and HPI.  ____________________________________________   PHYSICAL EXAM:  VITAL SIGNS: ED Triage Vitals  Enc Vitals Group     BP 06/18/19 1039 (!) 141/73     Pulse Rate 06/18/19 1039 81     Resp 06/18/19 1039 16     Temp 06/18/19 1039 98.1 F (36.7 C)     Temp Source 06/18/19 1039 Oral     SpO2 06/18/19 1039 100 %     Weight 06/18/19 1051 140 lb (63.5 kg)  Height 06/18/19 1051 5\' 4"  (1.626 m)     Head Circumference --      Peak Flow --      Pain Score 06/18/19 1051 0     Pain Loc --      Pain Edu? --      Excl. in [place]? --     Vital signs reviewed, nursing assessments reviewed.   Constitutional:   Alert and oriented. Non-toxic appearance. Eyes:   Conjunctivae are normal. EOMI. PERRL. ENT      Head:   Normocephalic and atraumatic.      Nose:   Wearing a mask.      Mouth/Throat:   Wearing a mask.      Neck:   No meningismus. Full ROM. Hematological/Lymphatic/Immunilogical:   No cervical lymphadenopathy. Cardiovascular:   RRR. Symmetric bilateral radial and DP pulses.  No murmurs. Cap refill less than 2  seconds. Respiratory:   Normal respiratory effort without tachypnea/retractions. Breath sounds are clear and equal bilaterally. No wheezes/rales/rhonchi. Gastrointestinal:   Soft with focal right lower quadrant tenderness. Non distended. There is no CVA tenderness.  No rebound, rigidity, or guarding. Musculoskeletal:   Normal range of motion in all extremities. No joint effusions.  No lower extremity tenderness.  No edema. Neurologic:   Normal speech and language.  Motor grossly intact. No acute focal neurologic deficits are appreciated.  Skin:    Skin is warm, dry and intact. No rash noted.  No petechiae, purpura, or bullae.  ____________________________________________    LABS (pertinent positives/negatives) (all labs ordered are listed, but only abnormal results are displayed) Labs Reviewed  URINALYSIS, COMPLETE (UACMP) WITH MICROSCOPIC - Abnormal; Notable for the following components:      Result Value   Color, Urine YELLOW (*)    APPearance HAZY (*)    Hgb urine dipstick SMALL (*)    All other components within normal limits  BASIC METABOLIC PANEL  CBC  POC URINE PREG, ED  POCT PREGNANCY, URINE   ____________________________________________   EKG    ____________________________________________    RADIOLOGY  No results found.  ____________________________________________   PROCEDURES Procedures  ____________________________________________  DIFFERENTIAL DIAGNOSIS   Passed kidney stone, UTI, ovarian cyst, appendicitis  CLINICAL IMPRESSION / ASSESSMENT AND PLAN / ED COURSE  Medications ordered in the ED: Medications - No data to display  Pertinent labs & imaging results that were available during my care of the patient were reviewed by me and considered in my medical decision making (see chart for details).  Vickie Raymond was evaluated in Emergency Department on 06/18/2019 for the symptoms described in the history of present illness. She was evaluated in  the context of the global COVID-19 pandemic, which necessitated consideration that the patient might be at risk for infection with the SARS-CoV-2 virus that causes COVID-19. Institutional protocols and algorithms that pertain to the evaluation of patients at risk for COVID-19 are in a state of rapid change based on information released by regulatory bodies including the CDC and federal and state organizations. These policies and algorithms were followed during the patient's care in the ED.   Patient presents with sudden onset of right flank pain radiating to right lower quadrant.  Symptoms have dramatically improved since arrival in the ED, vital signs are normal, but exam is concerning for right lower quadrant tenderness.  No symptoms to suggest STI PID TOA or torsion.  She is not pregnant.  Labs are reassuring, urinalysis shows hematuria without any evidence of infection.  Due  to the diagnostic uncertainty regarding the right lower quadrant anatomically, CT scan of the abdomen and pelvis is being obtained.  If unremarkable patient can be discharged home to continue usual follow-up.   ----------------------------------------- 2:36 PM on 06/18/2019 -----------------------------------------  CT negative, stable for discharge.     ____________________________________________   FINAL CLINICAL IMPRESSION(S) / ED DIAGNOSES    Final diagnoses:  Other microscopic hematuria  Right flank pain     ED Discharge Orders    None      Portions of this note were generated with dragon dictation software. Dictation errors may occur despite best attempts at proofreading.   Sharman Cheek, MD 06/18/19 [phone]

## 2021-08-11 IMAGING — CT CT RENAL STONE PROTOCOL
2 of 4 series · 16 of 46 positions shown, 18 images · non-contrast
Comparison: 08/08/2014

CLINICAL DATA: Right flank pain, history of kidney stones

EXAM:
CT ABDOMEN AND PELVIS WITHOUT CONTRAST
TECHNIQUE: Multidetector CT imaging of the abdomen and pelvis was performed
following the standard protocol without IV contrast.

[Series 2: stone full standard · axial · 0.71mm/px · z∈[-565,-185]mm · 13 of 84 slices shown, 15 images]
[im 4/84  soft-tissue]
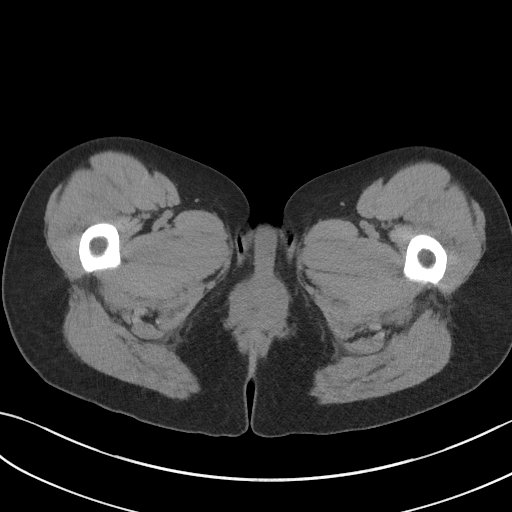
[im 4/84  bone]
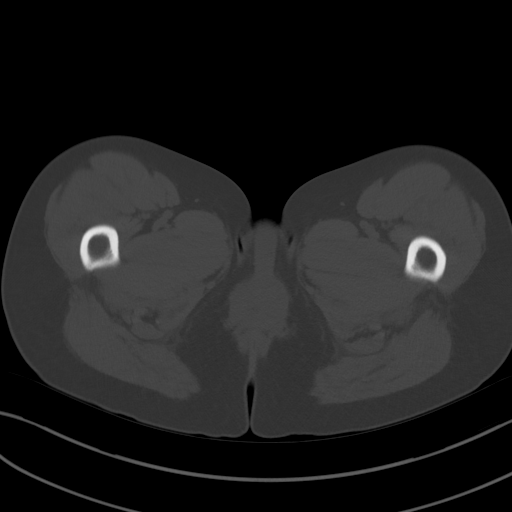
[im 10/84  soft-tissue]
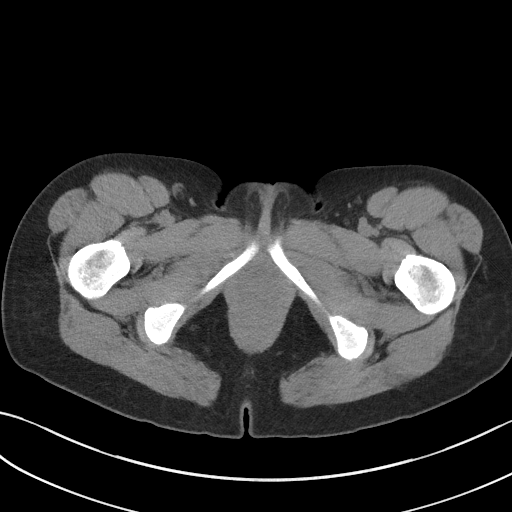
[im 16/84  soft-tissue]
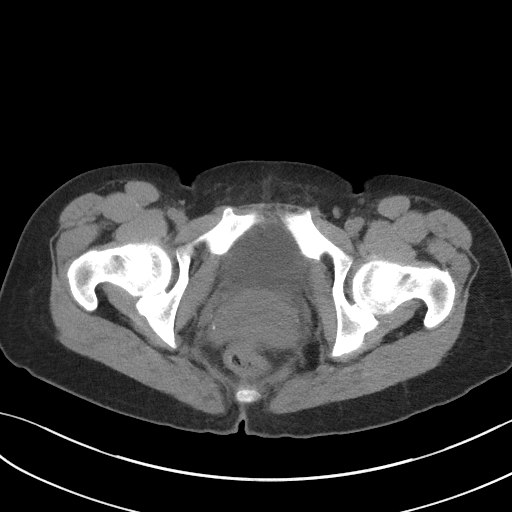
[im 23/84  soft-tissue]
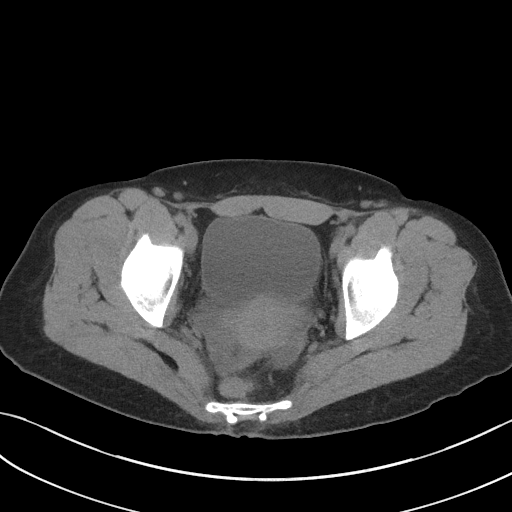
[im 29/84  soft-tissue]
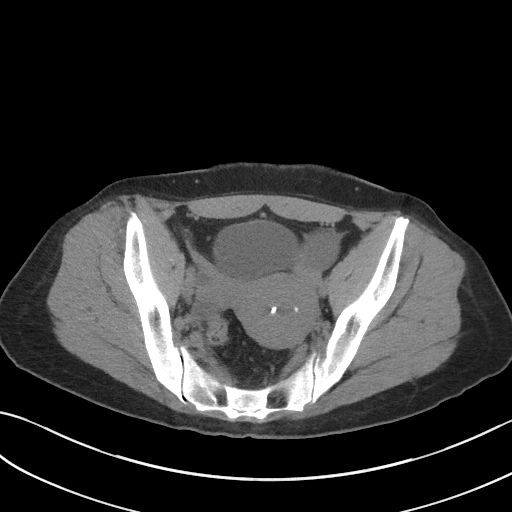
[im 36/84  soft-tissue]
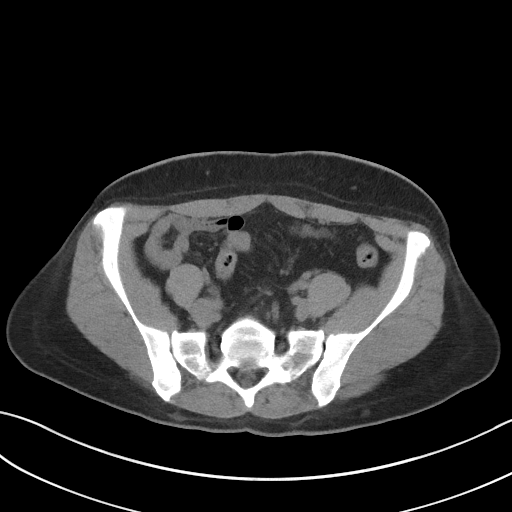
[im 42/84  soft-tissue]
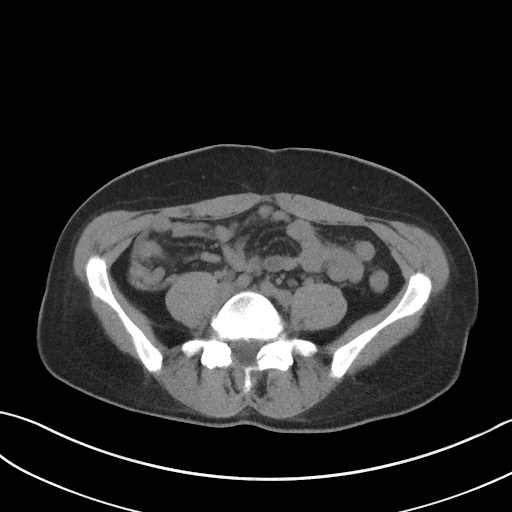
[im 48/84  soft-tissue]
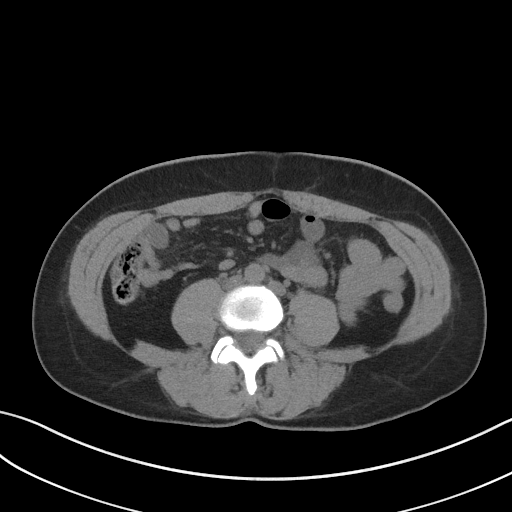
[im 55/84  soft-tissue]
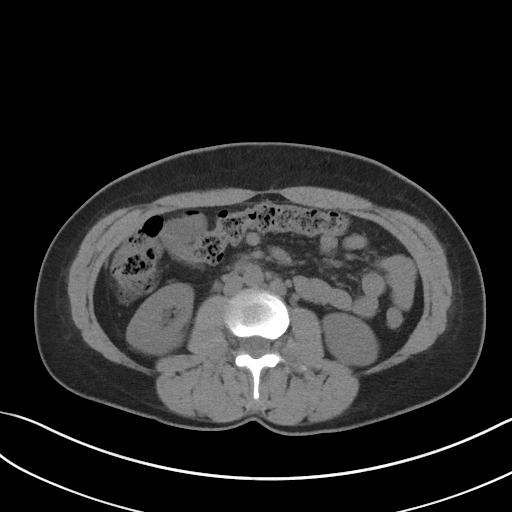
[im 55/84  bone]
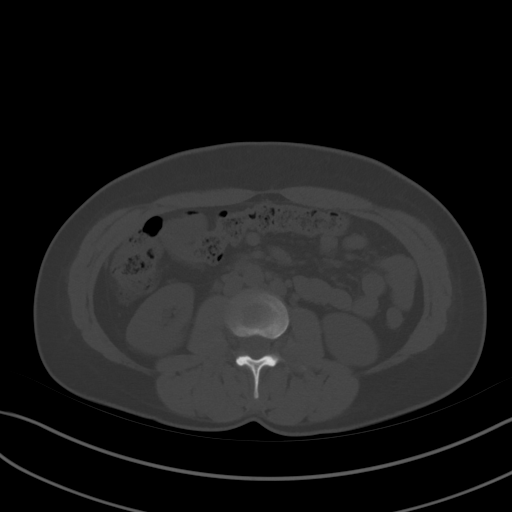
[im 61/84  soft-tissue]
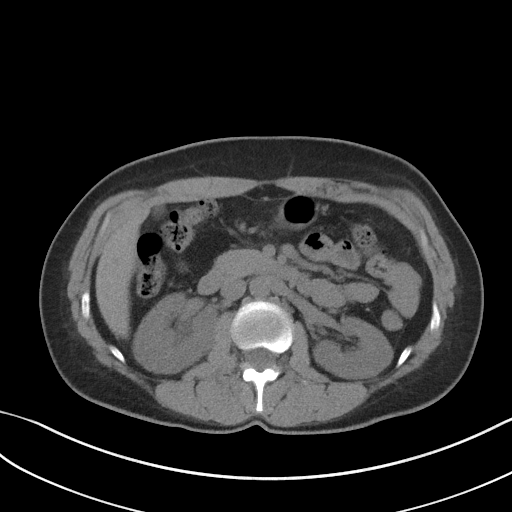
[im 68/84  soft-tissue]
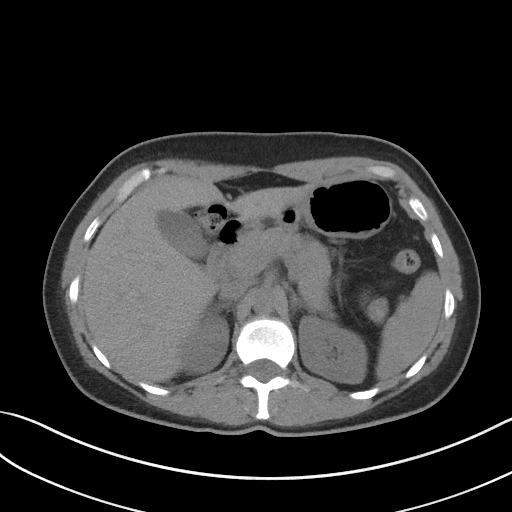
[im 74/84  soft-tissue]
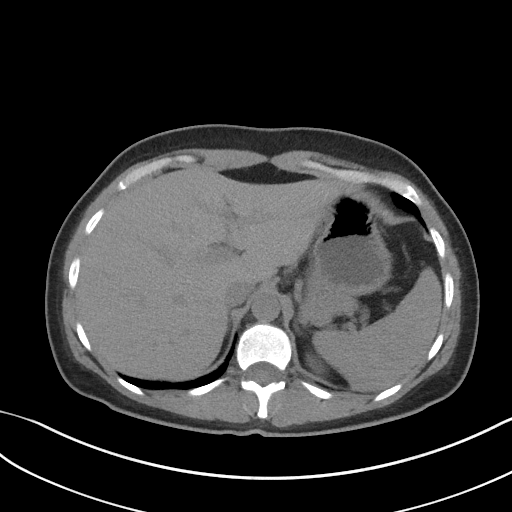
[im 80/84  soft-tissue]
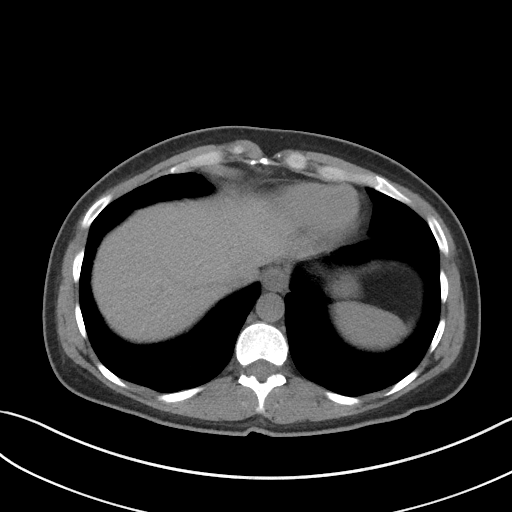

[Series 5: coronal · coronal · 0.69mm/px · 3 of 115 slices shown]
[im 39/115  soft-tissue]
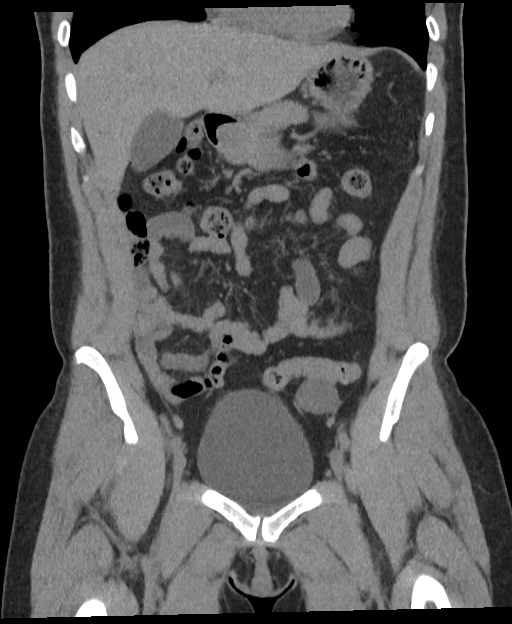
[im 51/115  soft-tissue]
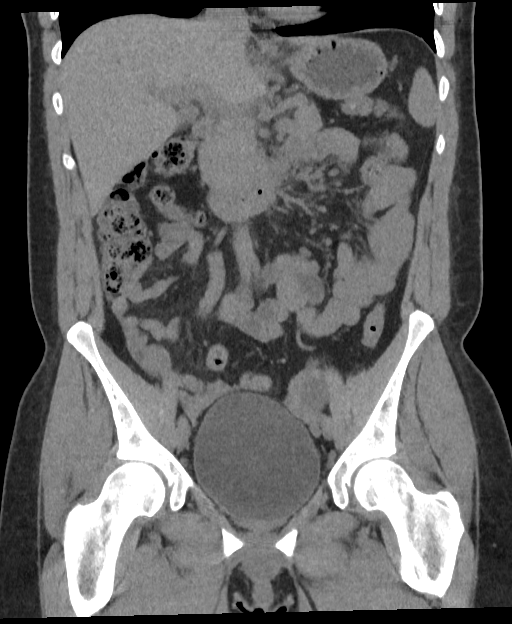
[im 64/115  soft-tissue]
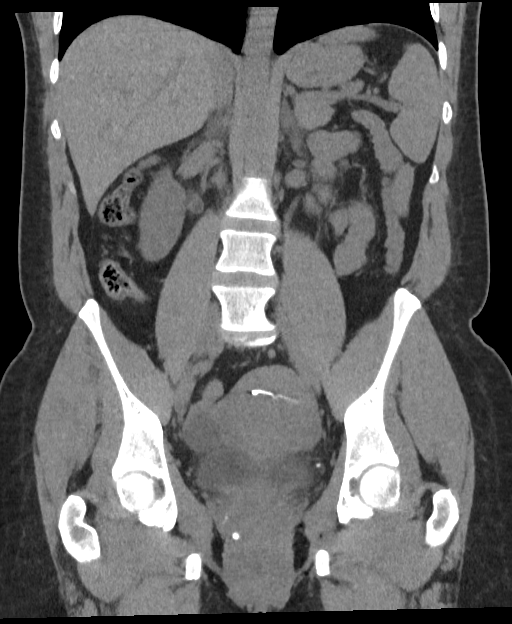

[16 of 46 positions shown; findings below may reference images not displayed]

FINDINGS: Lower chest: Lung bases are clear.

Hepatobiliary: Unenhanced liver is unremarkable.

Gallbladder is unremarkable. No intrahepatic or extrahepatic ductal
dilatation.

Pancreas: Within normal limits.

Spleen: Within normal limits.

Adrenals/Urinary Tract: Adrenal glands are within normal limits.

2 mm nonobstructing right upper pole renal calculus (coronal image
81). Left kidney is within normal limits. Mild fullness of the right
renal collecting system without frank hydronephrosis. When compared
to the remote prior study, there was a 6 mm left lower pole renal
calculus on that study which is no longer evident.

Bladder is within normal limits.

Stomach/Bowel: Stomach is within normal limits.

No evidence of bowel obstruction.

Normal appendix (series 2/image 41).

Vascular/Lymphatic: No evidence of abdominal aortic aneurysm.

Duplicated left IVC.

No suspicious abdominopelvic lymphadenopathy.

Reproductive: Uterus is notable for an IUD in satisfactory position.

Bilateral ovarian cysts/follicles, measuring 3.7 cm on the left and
3.6 cm on the right, likely physiologic.

Other: Small volume pelvic ascites.

Musculoskeletal: Mild superior end-plate Schmorl's node deformities
at L1 and L2. Degenerative changes at L5-S1.
IMPRESSION: 2 mm nonobstructing right upper pole renal calculus. Prior 6 mm left
lower pole renal calculus in 7529 is no longer evident. No frank
hydronephrosis.

No CT findings to account for the patient's right flank pain.

No evidence of bowel obstruction.  Normal appendix.

Bilateral variances/follicles, measuring up to 3.7 cm, likely
physiologic consider follow-up pelvic ultrasound in 6-10 weeks as
clinically warranted.

Additional ancillary findings as above.

## 2023-04-23 ENCOUNTER — Ambulatory Visit: Admitting: Obstetrics and Gynecology

## 2023-04-23 ENCOUNTER — Encounter: Payer: Self-pay | Admitting: Obstetrics and Gynecology

## 2023-04-23 VITALS — BP 132/83 | HR 71 | Ht 62.0 in | Wt 153.6 lb

## 2023-04-23 DIAGNOSIS — N3946 Mixed incontinence: Secondary | ICD-10-CM

## 2023-04-23 DIAGNOSIS — Z7689 Persons encountering health services in other specified circumstances: Secondary | ICD-10-CM

## 2023-04-23 DIAGNOSIS — N39498 Other specified urinary incontinence: Secondary | ICD-10-CM

## 2023-04-23 MED ORDER — TOLTERODINE TARTRATE ER 2 MG PO CP24: 2.0000 mg | ORAL_CAPSULE | Freq: Every day | ORAL | 0 refills | Status: DC

## 2023-04-23 NOTE — Progress Notes (Signed)
Patient presents today to discuss urinary incontinence. 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 She states her daily problems are a mixture of stress and urge incontinence. She is wanting to discuss possible surgical options at this time.

## 2023-04-23 NOTE — Progress Notes (Signed)
HPI:      [name] is a 52 y.o. G5P0 who LMP was No LMP recorded. (Menstrual status: IUD).  Subjective:   She presents today stating that she has been losing urine over the last few years.  It has gotten more of a problem in the last several months.  She believes it started with a deployment where she was holding her urine for long periods of time, carrying large heavy packs and developed a constant chronic cough for more than a year.  This seemed to make her urine loss much worse. Upon further discussion, patient has symptoms of both stress urinary incontinence and urge incontinence.  When specifically questioned the urge incontinence seems more of a problem than the stress incontinence.    Hx: The following portions of the patient's history were reviewed and updated as appropriate:             She  has a past medical history of ADD (attention deficit disorder), Depression, History of kidney stones, History of UTI, and Pain, flank, bilateral. She does not have any pertinent problems on file. She  has a past surgical history that includes kidney stone removal. Her family history includes Bladder Cancer in her paternal uncle; Cervical cancer in her mother; Colon cancer in her paternal grandmother. She  reports that she has never smoked. She has never used smokeless tobacco. She reports current alcohol use of about 5.0 standard drinks of alcohol per week. She reports that she does not use drugs. She has a current medication list which includes the following prescription(s): amlodipine, bupropion, levonorgestrel, methocarbamol, methylphenidate, sertraline, and trazodone. She is allergic to penicillin g and penicillins.       Review of Systems:  Review of Systems  Constitutional: Denied constitutional symptoms, night sweats, recent illness, fatigue, fever, insomnia and weight loss.  Eyes: Denied eye symptoms, eye pain, photophobia, vision change and visual disturbance.   Ears/Nose/Throat/Neck: Denied ear, nose, throat or neck symptoms, hearing loss, nasal discharge, sinus congestion and sore throat.  Cardiovascular: Denied cardiovascular symptoms, arrhythmia, chest pain/pressure, edema, exercise intolerance, orthopnea and palpitations.  Respiratory: Denied pulmonary symptoms, asthma, pleuritic pain, productive sputum, cough, dyspnea and wheezing.  Gastrointestinal: Denied, gastro-esophageal reflux, melena, nausea and vomiting.  Genitourinary: See HPI for additional information.  Musculoskeletal: Denied musculoskeletal symptoms, stiffness, swelling, muscle weakness and myalgia.  Dermatologic: Denied dermatology symptoms, rash and scar.  Neurologic: Denied neurology symptoms, dizziness, headache, neck pain and syncope.  Psychiatric: Denied psychiatric symptoms, anxiety and depression.  Endocrine: Denied endocrine symptoms including hot flashes and night sweats.   Meds:   Current Outpatient Medications on File Prior to Visit  Medication Sig Dispense Refill   amLODipine (NORVASC) 5 MG tablet Take 5 mg by mouth daily.     buPROPion (WELLBUTRIN XL) 150 MG 24 hr tablet Take 150 mg by mouth daily.     levonorgestrel (MIRENA) 20 MCG/DAY IUD 1 each by Intrauterine route once.     methocarbamol (ROBAXIN) 500 MG tablet Take 500 mg by mouth 2 (two) times daily as needed for muscle spasms.     methylphenidate (RITALIN LA) 20 MG 24 hr capsule Take 20 mg by mouth daily.     sertraline (ZOLOFT) 100 MG tablet Take 100 mg by mouth daily.     traZODone (DESYREL) 50 MG tablet Take by mouth at bedtime.     No current facility-administered medications on file prior to visit.      Objective:     Vitals:  04/23/23 1019  BP: 132/83  Pulse: 71   Filed Weights   04/23/23 1019  Weight: 153 lb 9.6 oz (69.7 kg)              Physical examination   Pelvic:   Vulva: Normal appearance.  No lesions.  Vagina: No lesions or abnormalities noted.  Support: Second-degree  uterine descensus, second-degree cystocele second-degree rectocele overall, the vaginal support seems fairly good in spite of her large babies at childbirth and her strenuous activities through life.  Urethra No masses tenderness or scarring.  Meatus Normal size without lesions or prolapse.  Cervix: Normal appearance.  No lesions.  Anus: Normal exam.  No lesions.  Perineum: Normal exam.  No lesions.        Bimanual   Uterus: Normal size.  Non-tender.  Mobile.  AV.  Adnexae: No masses.  Non-tender to palpation.  Cul-de-sac: Negative for abnormality.             Assessment:    G5P0 Patient Active Problem List   Diagnosis Date Noted   ADD (attention deficit disorder) 09/16/2015   Clinical depression 09/16/2015   Calculus of kidney 09/16/2015     1. Establishing care with new doctor, encounter for   2. Mixed incontinence urge and stress     Some pelvic relaxation but not more than second-degree.   Plan:            1.  We have spent some time discussing both stress incontinence and mixed incontinence.  Treatments for both have been discussed in detail.  Possible anterior posterior repair with hysterectomy discussed.  Possible TOT/sling discussed.  Because her problem seems mainly the urge incontinence at this time she would consider a trial of medication to try to fix this problem.  She would like some time to consider her other options regarding the stress incontinence. Will start Detrol LA. Patient to follow-up via MyChart and tell us if it is working.  And what she has thought of for decided upon regarding her other incontinence issue. All questions answered. Orders No orders of the defined types were placed in this encounter.   No orders of the defined types were placed in this encounter.     F/U  No follow-ups on file. I spent 34 minutes involved in the care of this patient preparing to see the patient by obtaining and reviewing her medical history (including labs,  imaging tests and prior procedures), documenting clinical information in the electronic health record (EHR), counseling and coordinating care plans, writing and sending prescriptions, ordering tests or procedures and in direct communicating with the patient and medical staff discussing pertinent items from her history and physical exam.  Elonda Husky, M.D. 04/23/2023 1:00 PM

## 2023-08-03 ENCOUNTER — Other Ambulatory Visit: Payer: Self-pay | Admitting: Obstetrics and Gynecology

## 2023-08-03 DIAGNOSIS — N3946 Mixed incontinence: Secondary | ICD-10-CM

## 2024-05-16 ENCOUNTER — Other Ambulatory Visit: Payer: Self-pay | Admitting: Family

## 2024-05-16 DIAGNOSIS — Z1231 Encounter for screening mammogram for malignant neoplasm of breast: Secondary | ICD-10-CM

## 2024-06-02 ENCOUNTER — Ambulatory Visit
Admission: RE | Admit: 2024-06-02 | Discharge: 2024-06-02 | Disposition: A | Discharge status: Home / Self Care | Source: Ambulatory Visit | Attending: Family | Admitting: Family

## 2024-06-02 DIAGNOSIS — Z1231 Encounter for screening mammogram for malignant neoplasm of breast: Secondary | ICD-10-CM | POA: Diagnosis present

## 2024-06-16 ENCOUNTER — Encounter: Payer: Self-pay | Admitting: Family

## 2024-06-19 ENCOUNTER — Other Ambulatory Visit: Payer: Self-pay | Admitting: Family Medicine

## 2024-06-19 DIAGNOSIS — N63 Unspecified lump in unspecified breast: Secondary | ICD-10-CM

## 2024-06-19 DIAGNOSIS — R928 Other abnormal and inconclusive findings on diagnostic imaging of breast: Secondary | ICD-10-CM

## 2024-07-18 ENCOUNTER — Other Ambulatory Visit: Payer: Self-pay | Admitting: Family Medicine

## 2024-07-18 DIAGNOSIS — R928 Other abnormal and inconclusive findings on diagnostic imaging of breast: Secondary | ICD-10-CM

## 2024-07-20 ENCOUNTER — Ambulatory Visit
Admission: RE | Admit: 2024-07-20 | Discharge: 2024-07-20 | Disposition: A | Discharge status: Home / Self Care | Source: Ambulatory Visit | Attending: Family Medicine | Admitting: Family Medicine

## 2024-07-20 DIAGNOSIS — R928 Other abnormal and inconclusive findings on diagnostic imaging of breast: Secondary | ICD-10-CM | POA: Insufficient documentation
